# Patient Record
Sex: Female | Born: 1955 | Race: White | Hispanic: No | Marital: Married | Smoking: Never smoker
Health system: Southern US, Community
[De-identification: ages and names within clinical notes are randomized; demographics above are authoritative.]

## PROBLEM LIST (undated history)

## (undated) DIAGNOSIS — F419 Anxiety disorder, unspecified: Secondary | ICD-10-CM

## (undated) DIAGNOSIS — I493 Ventricular premature depolarization: Secondary | ICD-10-CM

## (undated) DIAGNOSIS — G43909 Migraine, unspecified, not intractable, without status migrainosus: Secondary | ICD-10-CM

## (undated) DIAGNOSIS — K219 Gastro-esophageal reflux disease without esophagitis: Secondary | ICD-10-CM

## (undated) HISTORY — DX: Gastro-esophageal reflux disease without esophagitis: K21.9

## (undated) HISTORY — DX: Anxiety disorder, unspecified: F41.9

## (undated) HISTORY — DX: Migraine, unspecified, not intractable, without status migrainosus: G43.909

## (undated) HISTORY — DX: Ventricular premature depolarization: I49.3

---

## 1997-11-03 ENCOUNTER — Ambulatory Visit (HOSPITAL_COMMUNITY): Admission: RE | Admit: 1997-11-03 | Discharge: 1997-11-03 | Payer: Self-pay

## 1998-12-13 ENCOUNTER — Ambulatory Visit (HOSPITAL_COMMUNITY): Admission: RE | Admit: 1998-12-13 | Discharge: 1998-12-13 | Payer: Self-pay | Admitting: Obstetrics and Gynecology

## 1998-12-13 ENCOUNTER — Encounter: Payer: Self-pay | Admitting: Obstetrics and Gynecology

## 1999-12-17 ENCOUNTER — Ambulatory Visit (HOSPITAL_COMMUNITY): Admission: RE | Admit: 1999-12-17 | Discharge: 1999-12-17 | Payer: Self-pay | Admitting: Obstetrics and Gynecology

## 1999-12-17 ENCOUNTER — Encounter: Payer: Self-pay | Admitting: Obstetrics and Gynecology

## 2000-06-16 ENCOUNTER — Encounter: Payer: Self-pay | Admitting: Cardiology

## 2000-06-17 ENCOUNTER — Encounter: Payer: Self-pay | Admitting: Cardiology

## 2000-08-28 ENCOUNTER — Encounter: Payer: Self-pay | Admitting: Cardiology

## 2000-09-29 ENCOUNTER — Encounter: Payer: Self-pay | Admitting: Cardiology

## 2000-12-15 ENCOUNTER — Encounter: Payer: Self-pay | Admitting: Cardiology

## 2001-03-11 ENCOUNTER — Encounter: Payer: Self-pay | Admitting: Obstetrics and Gynecology

## 2001-03-11 ENCOUNTER — Ambulatory Visit (HOSPITAL_COMMUNITY): Admission: RE | Admit: 2001-03-11 | Discharge: 2001-03-11 | Payer: Self-pay | Admitting: Obstetrics and Gynecology

## 2002-05-18 ENCOUNTER — Encounter: Payer: Self-pay | Admitting: Obstetrics and Gynecology

## 2002-05-18 ENCOUNTER — Ambulatory Visit (HOSPITAL_COMMUNITY): Admission: RE | Admit: 2002-05-18 | Discharge: 2002-05-18 | Payer: Self-pay | Admitting: Obstetrics and Gynecology

## 2003-05-24 ENCOUNTER — Ambulatory Visit (HOSPITAL_COMMUNITY): Admission: RE | Admit: 2003-05-24 | Discharge: 2003-05-24 | Payer: Self-pay | Admitting: Obstetrics and Gynecology

## 2003-10-18 ENCOUNTER — Encounter: Payer: Self-pay | Admitting: Cardiology

## 2003-10-19 ENCOUNTER — Encounter: Payer: Self-pay | Admitting: Cardiology

## 2004-06-18 ENCOUNTER — Ambulatory Visit (HOSPITAL_COMMUNITY): Admission: RE | Admit: 2004-06-18 | Discharge: 2004-06-18 | Payer: Self-pay | Admitting: Obstetrics and Gynecology

## 2005-04-29 ENCOUNTER — Encounter: Payer: Self-pay | Admitting: Cardiology

## 2005-05-11 ENCOUNTER — Encounter: Payer: Self-pay | Admitting: Cardiology

## 2005-05-13 ENCOUNTER — Encounter: Payer: Self-pay | Admitting: Cardiology

## 2005-06-26 ENCOUNTER — Encounter: Payer: Self-pay | Admitting: Cardiology

## 2005-08-12 ENCOUNTER — Ambulatory Visit (HOSPITAL_COMMUNITY): Admission: RE | Admit: 2005-08-12 | Discharge: 2005-08-12 | Payer: Self-pay | Admitting: Obstetrics and Gynecology

## 2005-08-12 ENCOUNTER — Encounter: Payer: Self-pay | Admitting: Cardiology

## 2006-08-19 ENCOUNTER — Ambulatory Visit (HOSPITAL_COMMUNITY): Admission: RE | Admit: 2006-08-19 | Discharge: 2006-08-19 | Payer: Self-pay | Admitting: Obstetrics and Gynecology

## 2007-09-07 ENCOUNTER — Encounter: Admission: RE | Admit: 2007-09-07 | Discharge: 2007-09-07 | Payer: Self-pay | Admitting: Obstetrics and Gynecology

## 2008-09-19 ENCOUNTER — Ambulatory Visit (HOSPITAL_COMMUNITY): Admission: RE | Admit: 2008-09-19 | Discharge: 2008-09-19 | Payer: Self-pay | Admitting: Obstetrics and Gynecology

## 2009-04-05 ENCOUNTER — Encounter: Payer: Self-pay | Admitting: Cardiology

## 2009-05-30 ENCOUNTER — Ambulatory Visit: Payer: Self-pay | Admitting: Diagnostic Radiology

## 2009-05-30 ENCOUNTER — Ambulatory Visit (HOSPITAL_BASED_OUTPATIENT_CLINIC_OR_DEPARTMENT_OTHER): Admission: RE | Admit: 2009-05-30 | Discharge: 2009-05-30 | Payer: Self-pay | Admitting: Chiropractic Medicine

## 2009-10-31 ENCOUNTER — Encounter: Admission: RE | Admit: 2009-10-31 | Discharge: 2009-10-31 | Payer: Self-pay | Admitting: Obstetrics and Gynecology

## 2009-11-08 ENCOUNTER — Encounter: Payer: Self-pay | Admitting: Cardiology

## 2010-03-03 ENCOUNTER — Encounter: Payer: Self-pay | Admitting: Cardiology

## 2010-03-07 ENCOUNTER — Encounter: Payer: Self-pay | Admitting: Cardiology

## 2010-03-14 ENCOUNTER — Encounter: Payer: Self-pay | Admitting: Cardiology

## 2010-03-23 ENCOUNTER — Encounter: Payer: Self-pay | Admitting: Cardiology

## 2010-04-05 NOTE — Letter (Signed)
Summary: [facility] - EMERGENCY DEPT  [facility] - EMERGENCY DEPT   Imported By: Claudette Laws 03/09/2010 08:27:28  _____________________________________________________________________  External Attachment:    Type:   Image     Comment:   External Document

## 2010-04-05 NOTE — Letter (Signed)
Summary: [facility] - OFFICE NOTE  [facility] - OFFICE NOTE   Imported By: Claudette Laws 03/09/2010 08:25:22  _____________________________________________________________________  External Attachment:    Type:   Image     Comment:   External Document

## 2010-04-05 NOTE — Letter (Signed)
Summary: [facility]  [facility]   Imported By: Claudette Laws 03/09/2010 08:23:59  _____________________________________________________________________  External Attachment:    Type:   Image     Comment:   External Document

## 2010-04-18 ENCOUNTER — Ambulatory Visit (INDEPENDENT_AMBULATORY_CARE_PROVIDER_SITE_OTHER): Payer: BC Managed Care – PPO | Admitting: Cardiology

## 2010-04-18 ENCOUNTER — Encounter: Payer: Self-pay | Admitting: Cardiology

## 2010-04-18 DIAGNOSIS — R42 Dizziness and giddiness: Secondary | ICD-10-CM | POA: Insufficient documentation

## 2010-04-18 DIAGNOSIS — R002 Palpitations: Secondary | ICD-10-CM

## 2010-04-18 DIAGNOSIS — G43909 Migraine, unspecified, not intractable, without status migrainosus: Secondary | ICD-10-CM | POA: Insufficient documentation

## 2010-04-18 DIAGNOSIS — I2510 Atherosclerotic heart disease of native coronary artery without angina pectoris: Secondary | ICD-10-CM | POA: Insufficient documentation

## 2010-04-18 DIAGNOSIS — K219 Gastro-esophageal reflux disease without esophagitis: Secondary | ICD-10-CM | POA: Insufficient documentation

## 2010-04-18 DIAGNOSIS — F411 Generalized anxiety disorder: Secondary | ICD-10-CM | POA: Insufficient documentation

## 2010-04-18 DIAGNOSIS — R072 Precordial pain: Secondary | ICD-10-CM | POA: Insufficient documentation

## 2010-04-19 ENCOUNTER — Encounter: Payer: Self-pay | Admitting: Cardiology

## 2010-04-19 ENCOUNTER — Encounter (INDEPENDENT_AMBULATORY_CARE_PROVIDER_SITE_OTHER): Payer: Self-pay | Admitting: *Deleted

## 2010-04-25 ENCOUNTER — Encounter (INDEPENDENT_AMBULATORY_CARE_PROVIDER_SITE_OTHER): Payer: Self-pay | Admitting: *Deleted

## 2010-04-25 LAB — CONVERTED CEMR LAB
Glucose, Bld: 91 mg/dL (ref 70–99)
Sodium: 138 meq/L (ref 135–145)

## 2010-04-25 NOTE — Procedures (Signed)
Summary: Holter and Event/ [facility]  Holter and Event/ [facility]   Imported By: Dorise Hiss 04/17/2010 15:24:50  _____________________________________________________________________  External Attachment:    Type:   Image     Comment:   External Document

## 2010-04-25 NOTE — Miscellaneous (Signed)
Summary: Orders Update  Clinical Lists Changes  Orders: Added new Referral order of Cardiac CTA (Cardiac CTA) - Signed 

## 2010-04-25 NOTE — Letter (Signed)
Summary: Discharge Summary/ [facility]  Discharge Summary/ [facility]   Imported By: Dorise Hiss 04/17/2010 15:02:58  _____________________________________________________________________  External Attachment:    Type:   Image     Comment:   External Document

## 2010-04-25 NOTE — Letter (Signed)
Summary: REFERRAL FORM FOR CTA ORDER  REFERRAL FORM FOR CTA ORDER   Imported By: Zachary George 04/19/2010 15:08:36  _____________________________________________________________________  External Attachment:    Type:   Image     Comment:   External Document

## 2010-04-25 NOTE — Consult Note (Signed)
Summary: Consultation Report/ [facility] OUTPATINT CONSULT  Consultation Report/ [facility] OUTPATINT CONSULT   Imported By: Dorise Hiss 04/17/2010 15:10:14  _____________________________________________________________________  External Attachment:    Type:   Image     Comment:   External Document

## 2010-04-25 NOTE — Letter (Signed)
Summary: Internal Other/ PATIENT HISTORY FORM  Internal Other/ PATIENT HISTORY FORM   Imported By: Dorise Hiss 04/18/2010 16:24:24  _____________________________________________________________________  External Attachment:    Type:   Image     Comment:   External Document

## 2010-04-25 NOTE — Letter (Signed)
Summary: External Correspondence/ [facility] CLINIC NOTE  External Correspondence/ [facility] CLINIC NOTE   Imported By: Dorise Hiss 04/17/2010 15:23:17  _____________________________________________________________________  External Attachment:    Type:   Image     Comment:   External Document

## 2010-04-25 NOTE — Letter (Signed)
Summary: Discharge Summary/ [facility]  Discharge Summary/ [facility]   Imported By: Dorise Hiss 04/17/2010 15:06:19  _____________________________________________________________________  External Attachment:    Type:   Image     Comment:   External Document

## 2010-04-25 NOTE — Letter (Signed)
Summary: External Correspondence/ [facility] NEW PATIENT EVALUATION  External Correspondence/ [facility] NEW PATIENT EVALUATION   Imported By: Dorise Hiss 04/17/2010 15:37:09  _____________________________________________________________________  External Attachment:    Type:   Image     Comment:   External Document

## 2010-04-25 NOTE — Letter (Signed)
Summary: Generic Engineer, agricultural at [facility] [address]   [place], [place] [address]   Phone: [phone]  Fax: [phone]          04/19/2010  [facility] [address] [place], [place]  [address]  [place]  Dear Ms. HARNISH,    Zigmund Gottron is a copy of the order for your CTA ordered by Dr. Andee Lineman. If this date and time are not suitable for you please call our office at [phone] ext. [phone].        Sincerely,   Zachary George Patient Care Coordinator

## 2010-04-25 NOTE — Letter (Signed)
Summary: External Correspondence/ [facility] CLINIC NOTE  External Correspondence/ [facility] CLINIC NOTE   Imported By: Dorise Hiss 04/17/2010 14:57:47  _____________________________________________________________________  External Attachment:    Type:   Image     Comment:   External Document

## 2010-04-25 NOTE — Consult Note (Signed)
Summary: Consultation Report/ [facility] CARDIOLOGY  Consultation Report/ [facility] CARDIOLOGY   Imported By: Dorise Hiss 04/17/2010 14:47:45  _____________________________________________________________________  External Attachment:    Type:   Image     Comment:   External Document

## 2010-04-25 NOTE — Letter (Signed)
Summary: External Correspondence/ [facility] CLINIC NOTE  External Correspondence/ [facility] CLINIC NOTE   Imported By: Dorise Hiss 04/17/2010 14:59:05  _____________________________________________________________________  External Attachment:    Type:   Image     Comment:   External Document

## 2010-04-25 NOTE — Letter (Signed)
Summary: Discharge [facility]  Discharge Summary/ [facility]   Imported By: Dorise Hiss 04/17/2010 14:49:47  _____________________________________________________________________  External Attachment:    Type:   Image     Comment:   External Document

## 2010-05-01 NOTE — Letter (Signed)
Summary: Engineer, materials at [facility]  [address]   [place], [place] [address]   Phone: [phone]  Fax: [phone]        April 25, 2010 MRN: [id number]    [facility] [address] [place], [place]  [address]    Dear Ms. Verlon Setting,  Your test ordered by Selena Batten has been reviewed by your physician (or physician assistant) and was found to be normal or stable. Your physician (or physician assistant) felt no changes were needed at this time.  ____ Echocardiogram  ____ Cardiac Stress Test  _X___ Lab Work  ____ Peripheral vascular study of arms, legs or neck  ____ CT scan or X-ray  ____ Lung or Breathing test  ____ Other:   Thank you.   Cyril Loosen, RN, BSN    Duane Boston, M.D., F.A.C.C. Thressa Sheller, M.D., F.A.C.C. Oneal Grout, M.D., F.A.C.C. Cheree Ditto, M.D., F.A.C.C. Daiva Nakayama, M.D., F.A.C.C. Kenney Houseman, M.D., F.A.C.C. Jeanne Ivan, PA-C

## 2010-05-07 ENCOUNTER — Telehealth: Payer: Self-pay | Admitting: *Deleted

## 2010-05-08 ENCOUNTER — Other Ambulatory Visit (HOSPITAL_COMMUNITY): Payer: BC Managed Care – PPO

## 2010-05-10 NOTE — Assessment & Plan Note (Signed)
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  She was seen at [facility] for episodes of fainting sensations utilizing less than one minute duration usually at rest.  This has been associated with some palpitations.  She denies any chest pressure was of breath upon their PMD or syncope.  She is active and exercises regularly.  She also had a recent stress echocardiogram done which was within normal limits.  Chills of a prior echocardiogram done which was within normal limits.  The patient was scheduled for a cardiac monitor.  The patient reports concern about coronary disease because her brother had an MI at age 44.  The patient has history of migraine headaches in anxiety.   The patient also had bright tilt table testing done.  This was several years ago.  Reportedly this was negative. She reports sinking spells with particularly bad episode around Christmas which was associated with arm numbness.  The patient reported these episodes typically do not last very long.  There are no associated palpitations.  She still has occasional PVCs but over the years has learned to " ignore them". The patient was also taken multiple over-the-counter herbal preparations including St. John's wort and ginkgo biloba.   Preventive Screening-Counseling & Management  Alcohol-Tobacco     Smoking Status: never  Current Medications (verified): 1)  Estradiol 1 Mg Tabs (Estradiol) .... Take 1 Tablet By Mouth Two Times A Day 2)  Valtrex 500 Mg Tabs (Valacyclovir Hcl) .... Take 1 Tablet By Mouth Once A Day 3)  Cephalexin 500 Mg Caps (Cephalexin) .... Take 1 Tablet By Mouth Two Times A Day 4)  Aspir-Low 81 Mg Tbec (Aspirin) .... Take 1 Tablet By Mouth Once A Day 5)  Ibuprofen 200 Mg Caps (Ibuprofen) .... As  Needed 6)  Vitamin C 1000 Mg Tabs (Ascorbic Acid) .... Take 1 Tablet By Mouth Once A Day 7)  Caltrate 600+d 600-400 Mg-Unit Tabs (Calcium Carbonate-Vitamin D) .... Take 1 Tablet By Mouth Two Times A Day 8)  St Johns Wort 300 Mg Tabs ([address] Johns Wort) .... Take 1 Tablet By Mouth Three Times A Day 9)  Ginkgo Biloba Extract 60 Mg Caps (Ginkgo Biloba) .... Take 1 Tablet By Mouth Two Times A Day 10)  Ra Fish Oil 900 Mg Caps (Omega-3 Fatty Acids) .... Take 1 Tablet By Mouth Four Times A Day 11)  Feverfew 380 Mg Caps (Feverfew) .... Take 1 Tablet By Mouth Two Times A Day 12)  Dhea 10 Mg Tabs (Prasterone (Dhea)) .... Take 1/2 Tablet By Mouth Two Times A Day 13)  Protonix 40 Mg Tbec (Pantoprazole Sodium) .... Take 1 Tablet By Mouth Two Times A Day 14)  Mineral Complex  Caps (Multiple Minerals) .... Take 3 Daily 15)  Topamax 100 Mg Tabs (Topiramate) .... Take 1 Tablet By Mouth Once A Day 16)  Vitamin D 2000 Unit Tabs (Cholecalciferol) .... Take 1 Tablet By Mouth Once A Day 17)  Biotin 1000 Mcg Tabs (Biotin) .... Take 2 Every Day  Allergies (verified): 1)  ! Sulfa 2)  ! Phenergan 3)  ! Septra 4)  ! Augmentin  Comments:  Nurse/Medical Assistant: The patient's medications and allergies were reviewed with the patient and were updated in the Medication and Allergy Lists. Tammi Romine CMA (April 18, 2010 2:48 PM)  Past History:  Past Medical History: Last updated: 04/18/2010 G E R D Migraine headaches PVCs s/p ablation Sinking spells Anxiety  Family History: Last updated: 04/18/2010 Negative FH of Diabetes, Hypertension, or Coronary Artery Disease  Social History: Last updated: 04/18/2010 Tobacco Use - No.  Alcohol Use - no  Family History: Negative FH of Diabetes, Hypertension, or Coronary Artery Disease  Review of Systems       The patient complains of chest pain and palpitations.  The patient denies fatigue, malaise, fever, weight gain/loss, vision loss, decreased hearing,  hoarseness, shortness of breath, prolonged cough, wheezing, sleep apnea, coughing up blood, abdominal pain, blood in stool, nausea, vomiting, diarrhea, heartburn, incontinence, blood in urine, muscle weakness, joint pain, leg swelling, rash, skin lesions, headache, fainting, dizziness, depression, anxiety, enlarged lymph nodes, easy bruising or bleeding, and environmental allergies.    Vital Signs:  Patient profile:   55 year old female Height:      71 inches Weight:      159 pounds BMI:     22.26 Pulse rate:   70 / minute BP sitting:   116 / 72  (left arm) Cuff size:   regular  Vitals Entered By: Fuller Plan CMA (April 18, 2010 2:48 PM)  Serial Vital Signs/Assessments:  Time      Position  BP       Pulse  Resp  Temp     By 3:32 PM   Lying RA  111/69   66                    Lewayne Bunting, MD, [facility] 3:33 PM   Lying RA  121/78   71                    Lewayne Bunting, MD, [facility] 3:34 PM   Lying RA  110/72   71                    Lewayne Bunting, MD, [facility]   Physical Exam  Additional Exam:  General: Well-developed, well-nourished in no distress head: Normocephalic and atraumatic eyes PERRLA/EOMI intact, conjunctiva and lids normal nose: No deformity or lesions mouth normal dentition, normal posterior pharynx neck: Supple, no JVD.  No masses, thyromegaly or abnormal cervical nodes lungs: Normal breath sounds bilaterally without wheezing.  Normal percussion heart: regular rate and rhythm with normal S1 and S2, no S3 or S4.  PMI is normal.  No pathological murmurs.normal2 abdomen: Normal bowel sounds, abdomen is soft and nontender without masses, organomegaly or hernias noted.  No hepatosplenomegaly musculoskeletal: Back normal, normal gait muscle strength and tone normal pulsus: Pulse is normal in all 4 extremities Extremities: No peripheral pitting edema neurologic: Alert and oriented x 3 skin: Intact without lesions or rashes cervical nodes: No significant adenopathy psychologic: Normal  affect    Impression & Recommendations:  Problem # 1:  CORONARY ATHEROSCLEROSIS, NATIVE VESSEL (ICD-414.01) the patient has atypical symptoms for coronary artery disease.   However she does of his strong family history of CAD.  The patient will be referred for a cardiac CTA jet prior negative stress studies in the past..  She has been given instructions to take metoprolol 50 mg prior to the test. Her updated medication list for this problem includes:    Aspir-low 81 Mg Tbec (Aspirin) .Marland Kitchen... Take 1 tablet by mouth once a day    Metoprolol Tartrate 50 Mg Tabs (Metoprolol tartrate) .Marland Kitchen... Take 1  tablet by mouth one hour before test  Problem # 2:  PALPITATIONS (ICD-785.1) reviewed the patient's cardiac monitor..   No significant arrhythmias were noted. Her updated medication list for this problem includes:    Aspir-low 81 Mg Tbec (Aspirin) .Marland Kitchen... Take 1 tablet by mouth once a day    Metoprolol Tartrate 50 Mg Tabs (Metoprolol tartrate) .Marland Kitchen... Take 1 tablet by mouth one hour before test  Problem # 3:  ORTHOSTATIC DIZZINESS (ICD-780.4) will check orthostatic blood pressures.  Other Orders: T-Basic Metabolic Panel [phone])  Patient Instructions: 1)  Your physician has requested that you have a cardiac CT.  Cardiac computed tomography (CT) is a painless test that uses an x-ray machine to take clear, detailed pictures of your heart.  For further information please visit [email or web address]  Please follow instruction sheet as given. TAKE METOPROLOL 50MG  ONE HOUR BEFORE YOUR TEST. 2)  Your physician recommends that you return for lab work on tomorrow at your doctor's appt with your other labs. Please have results sent to our office for your BMET. 3)  Your physician wants you to follow-up in: 6 months.  You will receive a reminder letter in the mail about two months in advance. If you don't receive a letter, please call our office to schedule the follow-up appointment. Prescriptions: METOPROLOL  TARTRATE 50 MG TABS (METOPROLOL TARTRATE) Take 1 tablet by mouth one hour before test  #1 x 0   Entered by:   Carlye Grippe   Authorized by:   Lewayne Bunting, MD, [facility]   Signed by:   Carlye Grippe on 04/18/2010   Method used:   Electronically to        [facility] [phone]* (retail)       [address] [place], [place]  [address]       Ph: [phone] or [phone]       Fax: [phone]   RxID:   [id number]

## 2010-05-22 NOTE — Procedures (Signed)
Summary: Holter and Event/  [place]  Holter and Event/  [facility]   Imported By: Dorise Hiss 05/17/2010 08:42:26  _____________________________________________________________________  External Attachment:    Type:   Image     Comment:   External Document

## 2010-05-22 NOTE — Progress Notes (Signed)
Summary: cardiac ct cancelled      Phone Note Other Incoming   Summary of Call: Dawn Mcdonald   2:07 PM insurance company will not approve cardiac CTA on Dawn Mcdonald.I would notify her. They do agree to a cardiac catheterization but I do not think this is indicated and potentially risky.we can discuss again during her next office visit I would reassure her however that her stress echocardiogram was negative and therefore is extremely unlikely that she has significant coronary artery disease   Vicky Slaughter   3:07 PM tried calling Dawn Mcdonald @ home,work and cell no answer.   Not due to see you back for 6 months.  Do you want her to come in sooner ?    Initial call taken by: Hoover Brunette, LPN,  May 07, 2010 4:10 PM  Follow-up for Phone Call        Tried to reach patient again, left message on machine that cardiac CTA has been cancelled for tomorrow and to please return calll for more info.  Hoover Brunette, LPN  May 07, 3662 4:55 PM   Additional Follow-up for Phone Call Additional follow up Details #1::        Patient notified of above.   She questions if an appeal could be done.  Really concerned about brother who had similar symptoms, but ended up having 100% blockage.  Please advise. Really can't do appeal, unless she pays out of pocket. [facility] in [place] does them for 99 dollars. Don't know if she has family there.  Lewayne Bunting, MD, [facility]  May 11, 2010 12:27 PM     Additional Follow-up for Phone Call Additional follow up Details #2::    Left message to return call.  Hoover Brunette, LPN  May 15, 2010 12:43 PM   Left message on answering machine regarding info above.   Hoover Brunette, LPN  May 17, 2010 4:49 PM

## 2010-10-25 ENCOUNTER — Other Ambulatory Visit: Payer: Self-pay | Admitting: Obstetrics and Gynecology

## 2010-10-25 DIAGNOSIS — Z1231 Encounter for screening mammogram for malignant neoplasm of breast: Secondary | ICD-10-CM

## 2010-11-06 ENCOUNTER — Ambulatory Visit
Admission: RE | Admit: 2010-11-06 | Discharge: 2010-11-06 | Disposition: A | Discharge status: Home / Self Care | Payer: BC Managed Care – PPO | Source: Ambulatory Visit | Attending: Obstetrics and Gynecology | Admitting: Obstetrics and Gynecology

## 2010-11-06 DIAGNOSIS — Z1231 Encounter for screening mammogram for malignant neoplasm of breast: Secondary | ICD-10-CM

## 2011-09-10 ENCOUNTER — Encounter: Payer: Self-pay | Admitting: Cardiology

## 2011-10-07 ENCOUNTER — Other Ambulatory Visit: Payer: Self-pay | Admitting: Obstetrics and Gynecology

## 2011-10-07 DIAGNOSIS — Z1231 Encounter for screening mammogram for malignant neoplasm of breast: Secondary | ICD-10-CM

## 2011-11-12 ENCOUNTER — Ambulatory Visit (INDEPENDENT_AMBULATORY_CARE_PROVIDER_SITE_OTHER): Payer: BC Managed Care – PPO

## 2011-11-12 DIAGNOSIS — Z1231 Encounter for screening mammogram for malignant neoplasm of breast: Secondary | ICD-10-CM

## 2012-08-10 ENCOUNTER — Ambulatory Visit
Admission: RE | Admit: 2012-08-10 | Discharge: 2012-08-10 | Disposition: A | Discharge status: Home / Self Care | Payer: BC Managed Care – PPO | Source: Ambulatory Visit | Attending: Unknown Physician Specialty | Admitting: Unknown Physician Specialty

## 2012-08-10 ENCOUNTER — Other Ambulatory Visit: Payer: Self-pay | Admitting: Unknown Physician Specialty

## 2012-08-10 DIAGNOSIS — N6320 Unspecified lump in the left breast, unspecified quadrant: Secondary | ICD-10-CM

## 2013-08-16 ENCOUNTER — Other Ambulatory Visit: Payer: Self-pay | Admitting: Obstetrics and Gynecology

## 2013-08-16 DIAGNOSIS — Z1231 Encounter for screening mammogram for malignant neoplasm of breast: Secondary | ICD-10-CM

## 2013-08-19 ENCOUNTER — Ambulatory Visit (INDEPENDENT_AMBULATORY_CARE_PROVIDER_SITE_OTHER): Payer: BC Managed Care – PPO

## 2013-08-19 DIAGNOSIS — Z1231 Encounter for screening mammogram for malignant neoplasm of breast: Secondary | ICD-10-CM

## 2014-09-12 ENCOUNTER — Other Ambulatory Visit: Payer: Self-pay | Admitting: Obstetrics and Gynecology

## 2014-09-12 DIAGNOSIS — Z1231 Encounter for screening mammogram for malignant neoplasm of breast: Secondary | ICD-10-CM

## 2014-09-21 ENCOUNTER — Ambulatory Visit (INDEPENDENT_AMBULATORY_CARE_PROVIDER_SITE_OTHER): Payer: BC Managed Care – PPO

## 2014-09-21 DIAGNOSIS — Z1231 Encounter for screening mammogram for malignant neoplasm of breast: Secondary | ICD-10-CM

## 2015-03-08 ENCOUNTER — Other Ambulatory Visit: Payer: Self-pay | Admitting: Obstetrics and Gynecology

## 2015-03-08 DIAGNOSIS — Z789 Other specified health status: Secondary | ICD-10-CM

## 2015-03-10 ENCOUNTER — Other Ambulatory Visit: Payer: BC Managed Care – PPO

## 2015-03-14 ENCOUNTER — Other Ambulatory Visit: Payer: BC Managed Care – PPO

## 2015-03-16 ENCOUNTER — Ambulatory Visit
Admission: RE | Admit: 2015-03-16 | Discharge: 2015-03-16 | Disposition: A | Discharge status: Home / Self Care | Payer: BC Managed Care – PPO | Source: Ambulatory Visit | Attending: Obstetrics and Gynecology | Admitting: Obstetrics and Gynecology

## 2015-03-16 DIAGNOSIS — Z789 Other specified health status: Secondary | ICD-10-CM

## 2015-10-13 ENCOUNTER — Other Ambulatory Visit: Payer: Self-pay | Admitting: Obstetrics and Gynecology

## 2015-10-13 DIAGNOSIS — Z1231 Encounter for screening mammogram for malignant neoplasm of breast: Secondary | ICD-10-CM

## 2015-10-19 ENCOUNTER — Ambulatory Visit: Payer: BC Managed Care – PPO

## 2015-10-30 ENCOUNTER — Ambulatory Visit
Admission: RE | Admit: 2015-10-30 | Discharge: 2015-10-30 | Disposition: A | Discharge status: Home / Self Care | Payer: BC Managed Care – PPO | Source: Ambulatory Visit | Attending: Obstetrics and Gynecology | Admitting: Obstetrics and Gynecology

## 2015-10-30 DIAGNOSIS — Z1231 Encounter for screening mammogram for malignant neoplasm of breast: Secondary | ICD-10-CM

## 2016-10-10 ENCOUNTER — Other Ambulatory Visit: Payer: Self-pay | Admitting: Obstetrics and Gynecology

## 2016-10-10 DIAGNOSIS — Z1231 Encounter for screening mammogram for malignant neoplasm of breast: Secondary | ICD-10-CM

## 2016-10-31 ENCOUNTER — Ambulatory Visit
Admission: RE | Admit: 2016-10-31 | Discharge: 2016-10-31 | Disposition: A | Discharge status: Home / Self Care | Payer: 59 | Source: Ambulatory Visit | Attending: Obstetrics and Gynecology | Admitting: Obstetrics and Gynecology

## 2016-10-31 DIAGNOSIS — Z1231 Encounter for screening mammogram for malignant neoplasm of breast: Secondary | ICD-10-CM

## 2017-11-21 ENCOUNTER — Other Ambulatory Visit: Payer: Self-pay | Admitting: Obstetrics and Gynecology

## 2017-11-21 DIAGNOSIS — Z1231 Encounter for screening mammogram for malignant neoplasm of breast: Secondary | ICD-10-CM

## 2017-12-19 ENCOUNTER — Ambulatory Visit
Admission: RE | Admit: 2017-12-19 | Discharge: 2017-12-19 | Disposition: A | Discharge status: Home / Self Care | Payer: 59 | Source: Ambulatory Visit | Attending: Obstetrics and Gynecology | Admitting: Obstetrics and Gynecology

## 2017-12-19 DIAGNOSIS — Z1231 Encounter for screening mammogram for malignant neoplasm of breast: Secondary | ICD-10-CM

## 2018-11-05 ENCOUNTER — Other Ambulatory Visit: Payer: Self-pay | Admitting: Obstetrics and Gynecology

## 2018-11-05 DIAGNOSIS — Z1231 Encounter for screening mammogram for malignant neoplasm of breast: Secondary | ICD-10-CM

## 2018-11-20 IMAGING — MG DIGITAL SCREENING BILATERAL MAMMOGRAM WITH TOMO AND CAD
8 series · 9 of 24 positions shown · non-contrast
Comparison: Previous exam(s).

CLINICAL DATA: Screening.

EXAM:
DIGITAL SCREENING BILATERAL MAMMOGRAM WITH TOMO AND CAD

[R CC synth-2D]
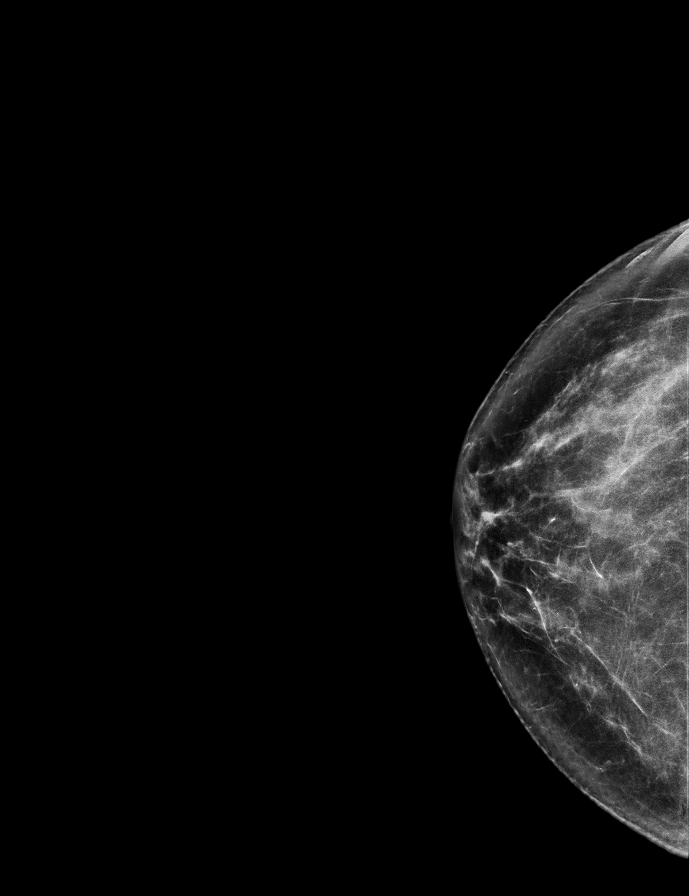

[L CC synth-2D]
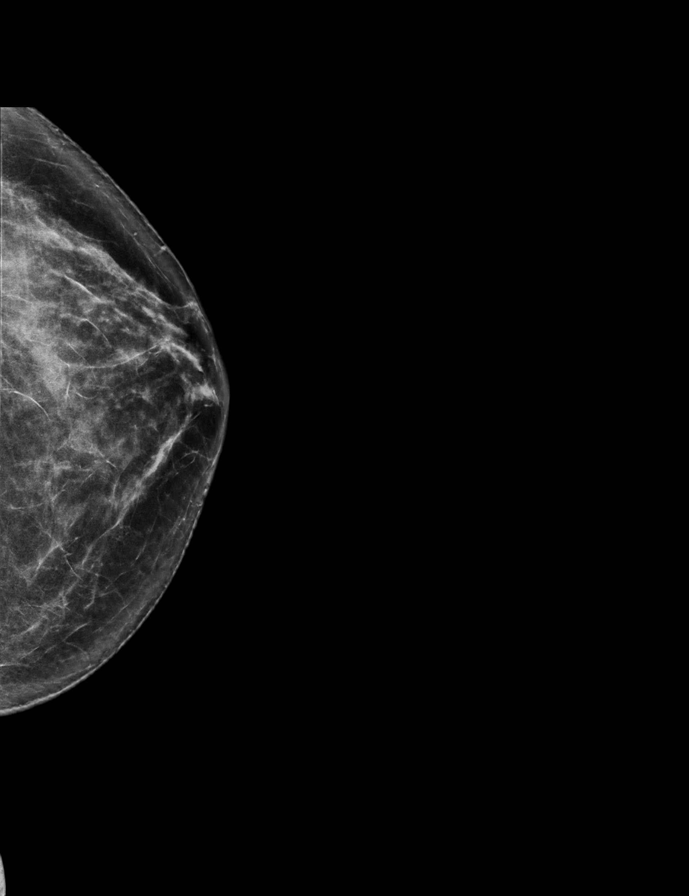

[R MLO synth-2D]
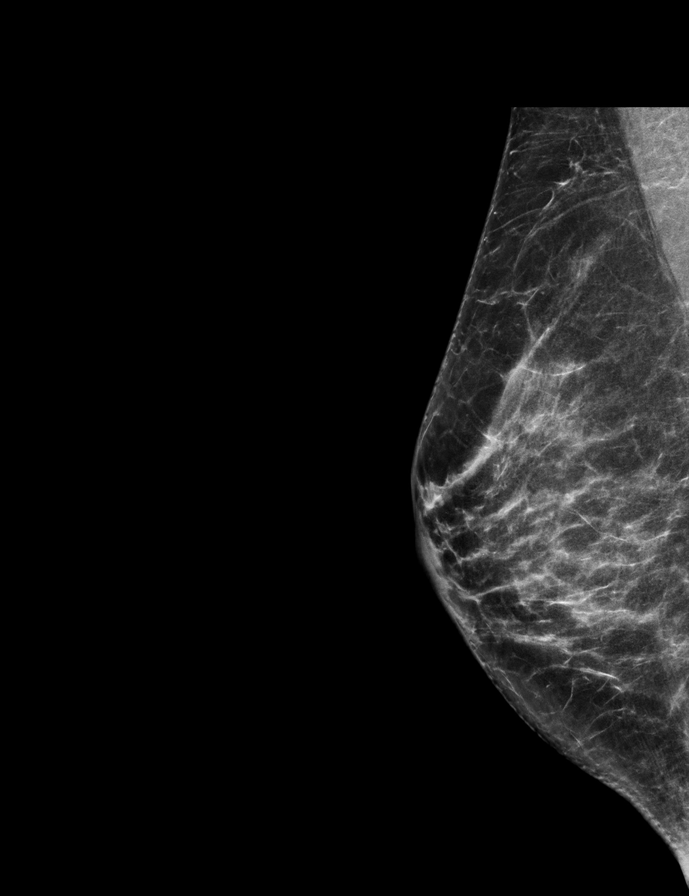

[L MLO synth-2D]
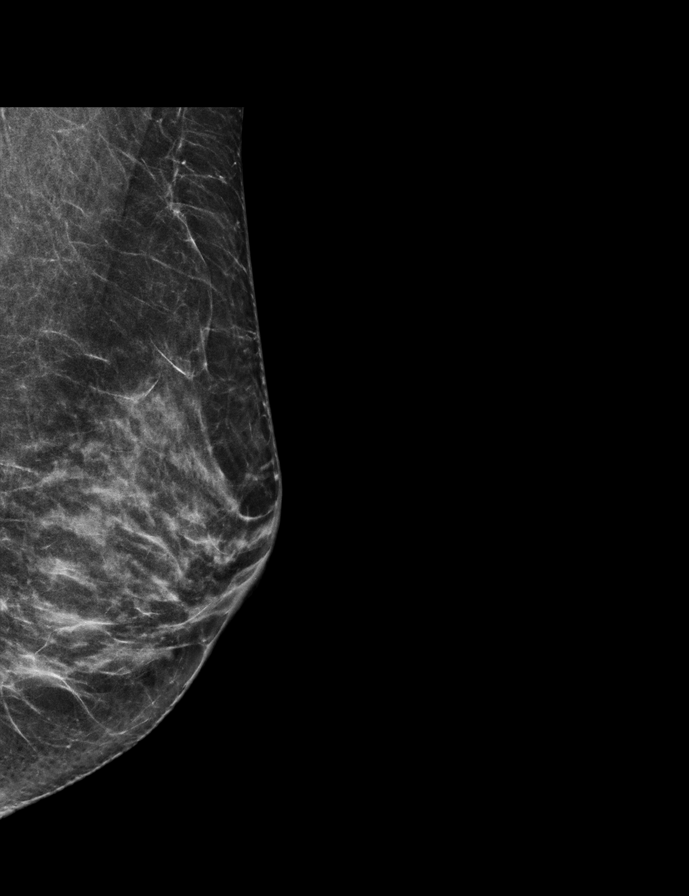

[L MLO tomo · 2 of 56 frames shown]
[frame 19/56]
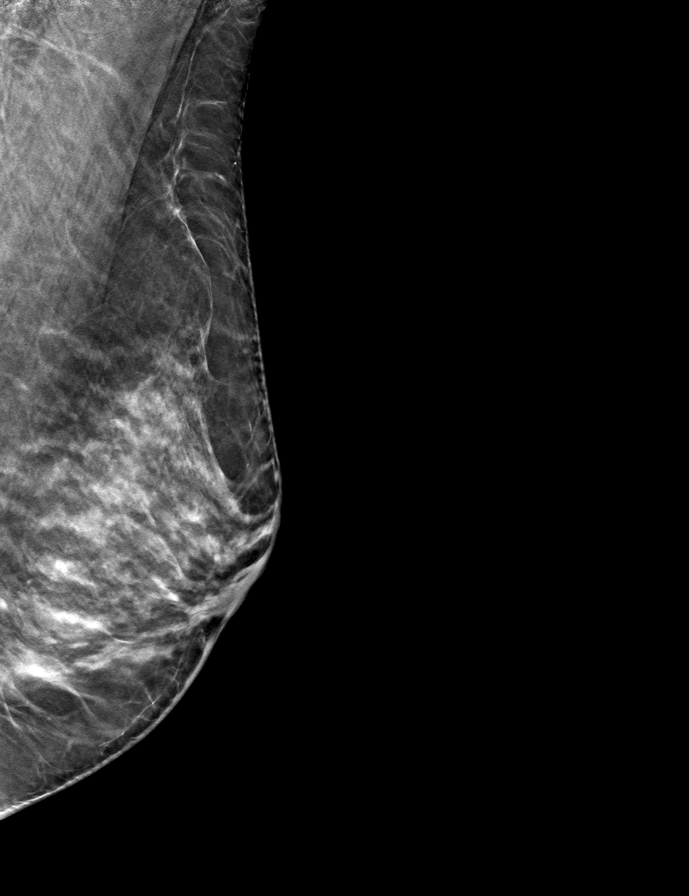
[frame 29/56]
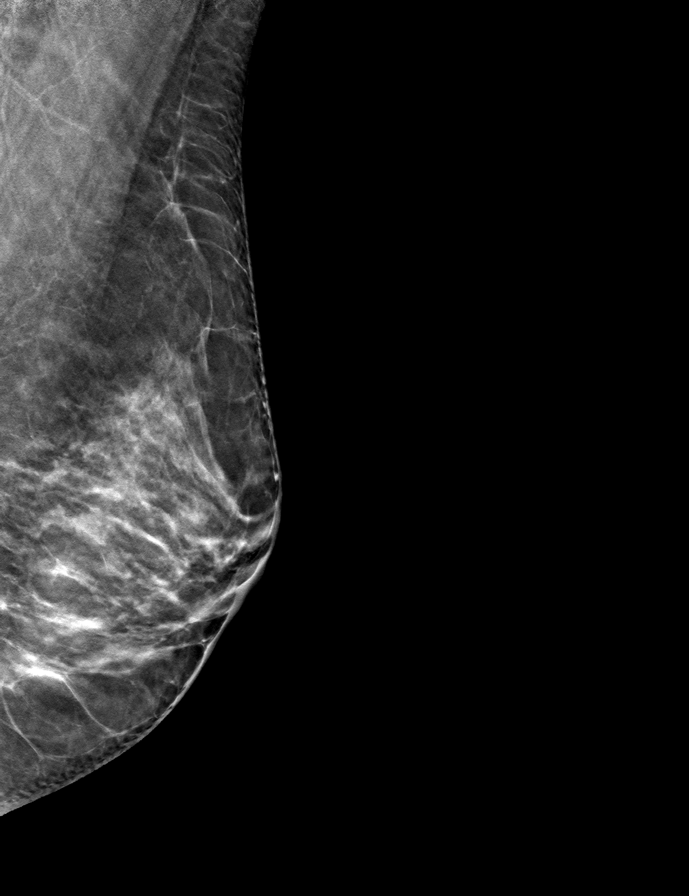

[R CC tomo · tomo slice 35/68.0]
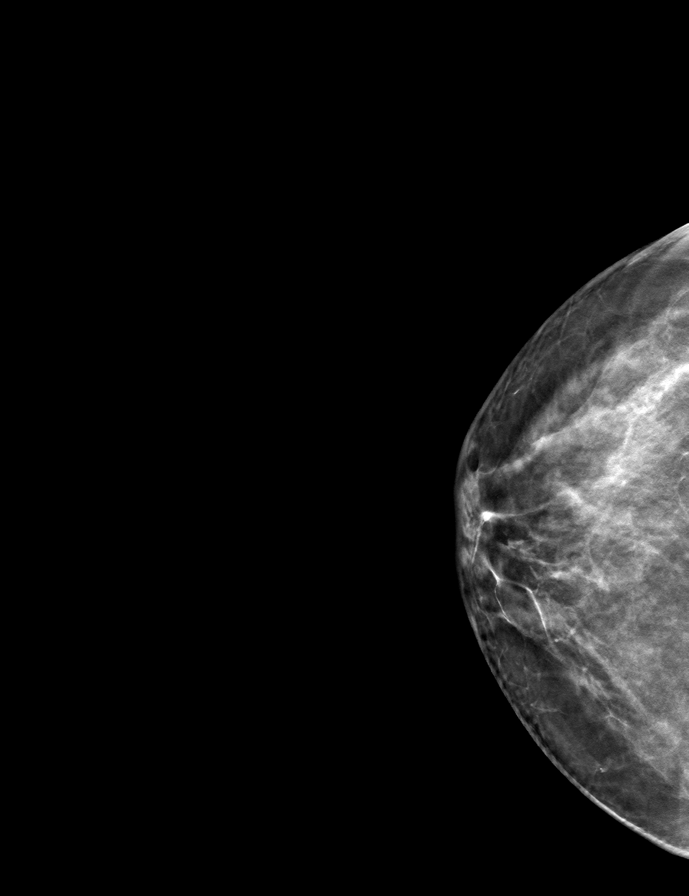

[L CC tomo · tomo slice 32/63.0]
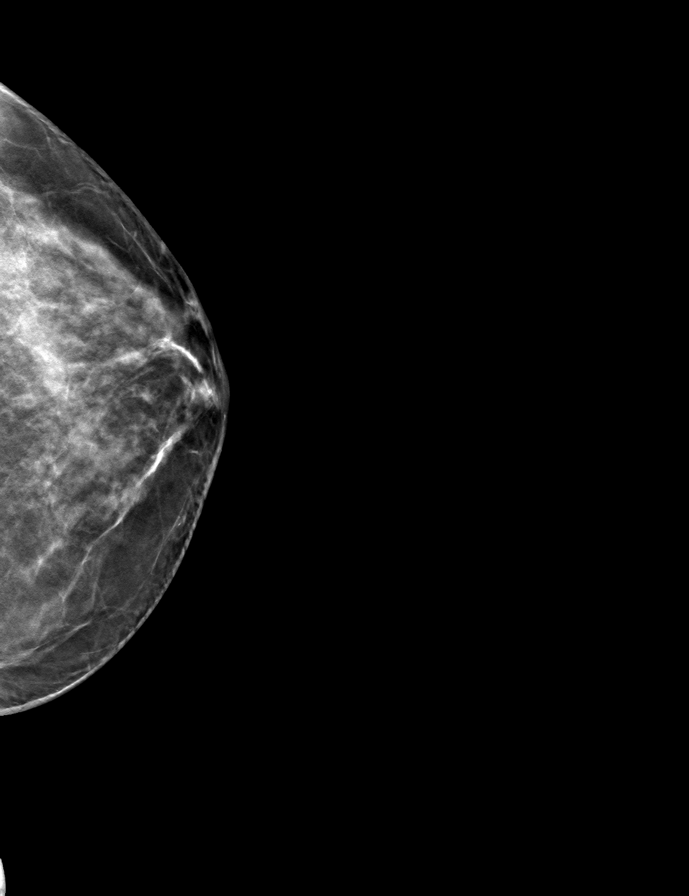

[R MLO tomo · tomo slice 31/62.0]
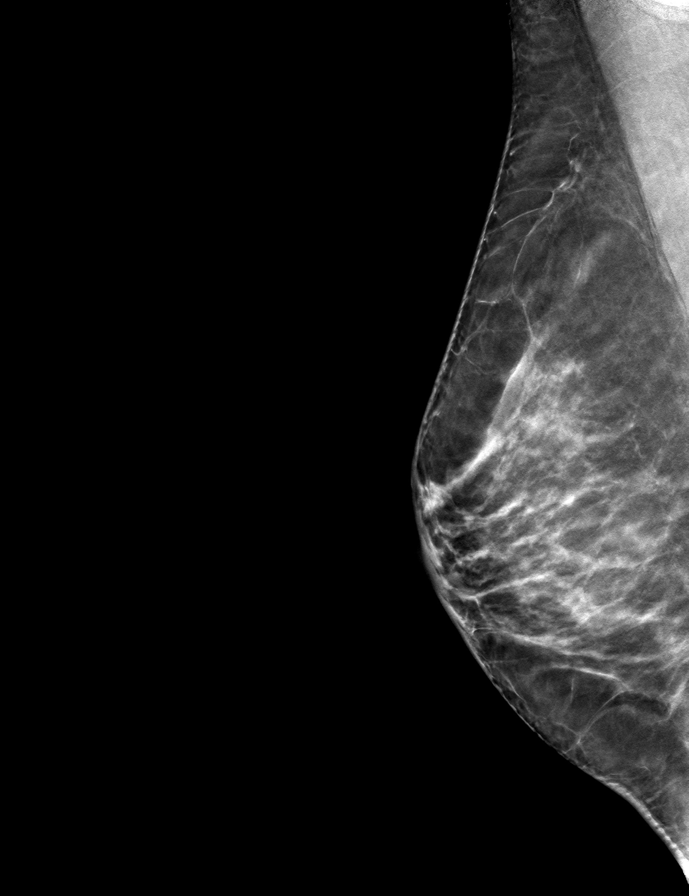

[9 of 24 positions shown; findings below may reference images not displayed]

ACR Breast Density Category c: The breast tissue is heterogeneously
dense, which may obscure small masses.
FINDINGS: There are no findings suspicious for malignancy. Images were
processed with CAD.
IMPRESSION: No mammographic evidence of malignancy. A result letter of this
screening mammogram will be mailed directly to the patient.

RECOMMENDATION:
Screening mammogram in one year. (Code:[id number])

BI-RADS CATEGORY  1: Negative.

## 2018-12-23 ENCOUNTER — Other Ambulatory Visit: Payer: Self-pay

## 2018-12-23 ENCOUNTER — Ambulatory Visit
Admission: RE | Admit: 2018-12-23 | Discharge: 2018-12-23 | Disposition: A | Discharge status: Home / Self Care | Payer: Medicare Other | Source: Ambulatory Visit | Attending: Obstetrics and Gynecology | Admitting: Obstetrics and Gynecology

## 2018-12-23 DIAGNOSIS — Z1231 Encounter for screening mammogram for malignant neoplasm of breast: Secondary | ICD-10-CM

## 2019-12-14 ENCOUNTER — Other Ambulatory Visit: Payer: Self-pay | Admitting: Obstetrics and Gynecology

## 2019-12-14 DIAGNOSIS — Z1231 Encounter for screening mammogram for malignant neoplasm of breast: Secondary | ICD-10-CM

## 2020-02-15 ENCOUNTER — Other Ambulatory Visit: Payer: Self-pay

## 2020-02-15 ENCOUNTER — Ambulatory Visit
Admission: RE | Admit: 2020-02-15 | Discharge: 2020-02-15 | Disposition: A | Discharge status: Home / Self Care | Payer: Medicare Other | Source: Ambulatory Visit | Attending: Obstetrics and Gynecology | Admitting: Obstetrics and Gynecology

## 2020-02-15 DIAGNOSIS — Z1231 Encounter for screening mammogram for malignant neoplasm of breast: Secondary | ICD-10-CM

## 2020-06-20 ENCOUNTER — Emergency Department (INDEPENDENT_AMBULATORY_CARE_PROVIDER_SITE_OTHER)
Admission: RE | Admit: 2020-06-20 | Discharge: 2020-06-20 | Disposition: A | Discharge status: Home / Self Care | Payer: Medicare Other | Source: Ambulatory Visit

## 2020-06-20 ENCOUNTER — Other Ambulatory Visit: Payer: Self-pay

## 2020-06-20 VITALS — BP 129/70 | HR 80 | Temp 99.0°F | Resp 16

## 2020-06-20 DIAGNOSIS — H6505 Acute serous otitis media, recurrent, left ear: Secondary | ICD-10-CM | POA: Diagnosis not present

## 2020-06-20 DIAGNOSIS — J3089 Other allergic rhinitis: Secondary | ICD-10-CM

## 2020-06-20 DIAGNOSIS — H6983 Other specified disorders of Eustachian tube, bilateral: Secondary | ICD-10-CM

## 2020-06-20 DIAGNOSIS — J3489 Other specified disorders of nose and nasal sinuses: Secondary | ICD-10-CM | POA: Diagnosis not present

## 2020-06-20 MED ORDER — PREDNISONE 20 MG PO TABS: 20.0000 mg | ORAL_TABLET | Freq: Two times a day (BID) | ORAL | 0 refills | Status: AC

## 2020-06-20 MED ORDER — FLUTICASONE PROPIONATE 50 MCG/ACT NA SUSP: 2.0000 | Freq: Every day | NASAL | 0 refills | Status: AC

## 2020-06-20 MED ORDER — CEFADROXIL 500 MG PO CAPS: 500.0000 mg | ORAL_CAPSULE | Freq: Two times a day (BID) | ORAL | 0 refills | Status: AC

## 2020-06-20 NOTE — ED Triage Notes (Signed)
Patient c/o bilateral ear discomfort about 3 weeks ago.  It feels she had "cotton in her ears."  Possible sinus infection, afebrile.  Patient has been taking Allegra and Ibuprofen.  Patient is fully vaccinated.

## 2020-06-20 NOTE — ED Provider Notes (Signed)
[facility]    CSN: [id number] Arrival date & time: 06/20/20  1655      History   Chief Complaint Chief Complaint  Patient presents with  . Appointment    Bilateral Ear Pain/Congestion    HPI Dawn Mcdonald is a 65 y.o. female.   HPI  Pleasant 65 year old in good health.  Retired Runner, broadcasting/film/video.  She does have seasonal and environmental allergies.  She states she has been having trouble for the last 3 weeks.  She states that her sinuses are draining "green chunks" when she blows her nose.  She has ear pressure and pain.  Decreased hearing on the left.  Some sore throat.  She states that she has headache.  She is feels like her face is "stuffed full of cotton".  Headache.  Past Medical History:  Diagnosis Date  . Anxiety   . GERD (gastroesophageal reflux disease)   . Migraine   . PVC (premature ventricular contraction)    s/p ablation    Patient Active Problem List   Diagnosis Date Noted  . ANXIETY STATE, UNSPECIFIED 04/18/2010  . MIGRAINE HEADACHE 04/18/2010  . CORONARY ATHEROSCLEROSIS, NATIVE VESSEL 04/18/2010  . GERD 04/18/2010  . PALPITATIONS 04/18/2010  . PRECORDIAL PAIN 04/18/2010  . ORTHOSTATIC DIZZINESS 04/18/2010    History reviewed. No pertinent surgical history.  OB History   No obstetric history on file.      Home Medications    Prior to Admission medications   Medication Sig Start Date End Date Taking? Authorizing Provider  Ascorbic Acid (VITAMIN C) 1000 MG tablet Take 1,000 mg by mouth daily.   Yes [provider]  aspirin 81 MG tablet Take 81 mg by mouth daily.   Yes [provider]  BIOTIN PO Take by mouth.   Yes [provider]  Calcium Carbonate-Vitamin D (CALTRATE 600+D PO) Take by mouth.   Yes [provider]  cefadroxil (DURICEF) 500 MG capsule Take 1 capsule (500 mg total) by mouth 2 (two) times daily. 06/20/20  Yes Eustace Moore, MD  Cholecalciferol (VITAMIN D) 2000 UNITS CAPS Take by mouth  daily.   Yes [provider]  Coenzyme Q10 (COQ10 PO) Take by mouth.   Yes [provider]  estradiol (ESTRACE) 1 MG tablet Take 1 mg by mouth 2 (two) times daily.   Yes [provider]  fluticasone (FLONASE) 50 MCG/ACT nasal spray Place 2 sprays into both nostrils daily. 06/20/20  Yes Eustace Moore, MD  Ginkgo Biloba 40 MG TABS Take by mouth 2 (two) times daily.   Yes [provider]  ibuprofen (ADVIL,MOTRIN) 200 MG tablet Take 200 mg by mouth every 6 (six) hours as needed.   Yes [provider]  Omega-3 Fatty Acids (FISH OIL PO) Take by mouth 4 (four) times daily.   Yes [provider]  pantoprazole (PROTONIX) 40 MG tablet Take 40 mg by mouth 2 (two) times daily.   Yes [provider]  predniSONE (DELTASONE) 20 MG tablet Take 1 tablet (20 mg total) by mouth 2 (two) times daily with a meal. 06/20/20  Yes Eustace Moore, MD  topiramate (TOPAMAX) 100 MG tablet Take 100 mg by mouth daily.   Yes [provider]  valACYclovir (VALTREX) 500 MG tablet Take 500 mg by mouth daily.   Yes [provider]  DHEA 10 MG TABS Take 5 mg by mouth daily.    [provider]  Feverfew 380 MG CAPS Take by mouth 2 (  two) times daily.    [provider]  metoprolol (LOPRESSOR) 50 MG tablet Take 50 mg by mouth as directed.    [provider]  Multiple Vitamins-Minerals (MULTIVITAMIN PO) Take by mouth.    [provider]  [facility] Wort 300 MG CAPS Take by mouth 3 (three) times daily.    [provider]    Family History Family History  Problem Relation Age of Onset  . Breast cancer Neg Hx     Social History Social History   Tobacco Use  . Smoking status: Never Smoker  . Smokeless tobacco: Never Used  Substance Use Topics  . Alcohol use: No     Allergies   Amoxicillin-pot clavulanate, Promethazine hcl, and Sulfamethoxazole-trimethoprim   Review of Systems Review of  Systems  See HPI Physical Exam Triage Vital Signs ED Triage Vitals [06/20/20 1710]  Enc Vitals Group     BP 129/70     Pulse Rate 80     Resp 16     Temp 99 F (37.2 C)     Temp Source Oral     SpO2 98 %     Weight      Height      Head Circumference      Peak Flow      Pain Score 5     Pain Loc      Pain Edu?      Excl. in GC?    No data found.  Updated Vital Signs BP 129/70 (BP Location: Left Arm)   Pulse 80   Temp 99 F (37.2 C) (Oral)   Resp 16   SpO2 98%      Physical Exam Constitutional:      General: She is not in acute distress.    Appearance: Normal appearance. She is well-developed and normal weight.  HENT:     Head: Normocephalic and atraumatic.     Right Ear: Tympanic membrane, ear canal and external ear normal.     Left Ear: Ear canal and external ear normal.     Ears:     Comments: TM dull/injected    Nose: Congestion present.     Comments: Nasal membranes are red and swollen.  Sinus tenderness maxillary and ethmoid    Mouth/Throat:     Mouth: Mucous membranes are moist.     Pharynx: Posterior oropharyngeal erythema present.     Comments: Posterior pharynx mildly injected Eyes:     Conjunctiva/sclera: Conjunctivae normal.     Pupils: Pupils are equal, round, and reactive to light.  Cardiovascular:     Rate and Rhythm: Normal rate and regular rhythm.     Heart sounds: Normal heart sounds.  Pulmonary:     Effort: Pulmonary effort is normal. No respiratory distress.     Breath sounds: Normal breath sounds.  Abdominal:     General: There is no distension.     Palpations: Abdomen is soft.  Musculoskeletal:        General: Normal range of motion.     Cervical back: Normal range of motion.  Lymphadenopathy:     Cervical: No cervical adenopathy.  Skin:    General: Skin is warm and dry.  Neurological:     Mental Status: She is alert.  Psychiatric:        Behavior: Behavior normal.      UC Treatments / Results  Labs (all labs ordered  are listed, but only abnormal results are displayed) Labs Reviewed - No  data to display  EKG   Radiology No results found.  Procedures Procedures (including critical care time)  Medications Ordered in UC Medications - No data to display  Initial Impression / Assessment and Plan / UC Course  I have reviewed the triage vital signs and the nursing notes.  Pertinent labs & imaging results that were available during my care of the patient were reviewed by me and considered in my medical decision making (see chart for details).     Since patient has had symptoms for 3 weeks we will treat with antibiotics.  I Minna cover her with prednisone for the allergy symptoms.  Continue with her other allergy medications.  Add Flonase.  See PCP in follow-up Final Clinical Impressions(s) / UC Diagnoses   Final diagnoses:  Eustachian tube dysfunction, bilateral  Recurrent acute serous otitis media of left ear  Pain of maxillary sinus  Environmental and seasonal allergies     Discharge Instructions     Continue drinking plenty of water Continue Mucinex D Use Flonase 2 times a day until symptoms improve, and then once a day through allergy season Take prednisone 2 times a day for 5 days Take the antibiotic 2 times a day Expect improvement in 2 to 3 days    ED Prescriptions    Medication Sig Dispense Auth. Provider   fluticasone (FLONASE) 50 MCG/ACT nasal spray Place 2 sprays into both nostrils daily. 16 g Eustace Moore, MD   predniSONE (DELTASONE) 20 MG tablet Take 1 tablet (20 mg total) by mouth 2 (two) times daily with a meal. 10 tablet Eustace Moore, MD   cefadroxil (DURICEF) 500 MG capsule Take 1 capsule (500 mg total) by mouth 2 (two) times daily. 14 capsule Eustace Moore, MD     PDMP not reviewed this encounter.   Eustace Moore, MD 06/20/20 1754

## 2020-06-20 NOTE — Discharge Instructions (Addendum)
Continue drinking plenty of water Continue Mucinex D Use Flonase 2 times a day until symptoms improve, and then once a day through allergy season Take prednisone 2 times a day for 5 days Take the antibiotic 2 times a day Expect improvement in 2 to 3 days

## 2020-06-27 ENCOUNTER — Telehealth: Payer: Self-pay | Admitting: Emergency Medicine

## 2020-06-27 NOTE — Telephone Encounter (Signed)
Dawn Mcdonald called due to her head feeling "full w/ pressure to left ear". Pt confirmed her last antibiotic was this morning, she is continuing w/ the flonase & will add in nasal saline rinse/wash a few times a day. Dawn Mcdonald to follow up with her PCP or to return to Urgent Care if no improvement over the next 2-3 days. Dawn Mcdonald verbalized an understanding

## 2021-01-08 ENCOUNTER — Other Ambulatory Visit: Payer: Self-pay | Admitting: Obstetrics and Gynecology

## 2021-01-08 DIAGNOSIS — Z1231 Encounter for screening mammogram for malignant neoplasm of breast: Secondary | ICD-10-CM

## 2021-02-15 ENCOUNTER — Ambulatory Visit: Payer: Medicare Other

## 2021-03-16 ENCOUNTER — Other Ambulatory Visit: Payer: Self-pay

## 2021-03-16 ENCOUNTER — Ambulatory Visit
Admission: RE | Admit: 2021-03-16 | Discharge: 2021-03-16 | Disposition: A | Discharge status: Home / Self Care | Payer: Medicare Other | Source: Ambulatory Visit | Attending: Obstetrics and Gynecology | Admitting: Obstetrics and Gynecology

## 2021-03-16 DIAGNOSIS — Z1231 Encounter for screening mammogram for malignant neoplasm of breast: Secondary | ICD-10-CM

## 2022-04-29 ENCOUNTER — Other Ambulatory Visit: Payer: Self-pay | Admitting: Obstetrics and Gynecology

## 2022-04-29 DIAGNOSIS — Z1231 Encounter for screening mammogram for malignant neoplasm of breast: Secondary | ICD-10-CM

## 2022-05-02 ENCOUNTER — Ambulatory Visit
Admission: RE | Admit: 2022-05-02 | Discharge: 2022-05-02 | Disposition: A | Discharge status: Home / Self Care | Payer: PPO | Source: Ambulatory Visit | Attending: Obstetrics and Gynecology | Admitting: Obstetrics and Gynecology

## 2022-05-02 DIAGNOSIS — Z1231 Encounter for screening mammogram for malignant neoplasm of breast: Secondary | ICD-10-CM

## 2023-04-02 ENCOUNTER — Other Ambulatory Visit: Payer: Self-pay | Admitting: Obstetrics and Gynecology

## 2023-04-02 DIAGNOSIS — Z1231 Encounter for screening mammogram for malignant neoplasm of breast: Secondary | ICD-10-CM

## 2023-05-07 ENCOUNTER — Ambulatory Visit: Payer: PPO

## 2023-05-21 ENCOUNTER — Ambulatory Visit
Admission: RE | Admit: 2023-05-21 | Discharge: 2023-05-21 | Disposition: A | Discharge status: Home / Self Care | Source: Ambulatory Visit | Attending: Obstetrics and Gynecology | Admitting: Obstetrics and Gynecology

## 2023-05-21 DIAGNOSIS — Z1231 Encounter for screening mammogram for malignant neoplasm of breast: Secondary | ICD-10-CM
